# Patient Record
Sex: Male | Born: 1990 | Hispanic: Yes | Marital: Single | Smoking: Never smoker
Health system: Southern US, Community
[De-identification: ages and names within clinical notes are randomized; demographics above are authoritative.]

## PROBLEM LIST (undated history)

## (undated) DIAGNOSIS — E785 Hyperlipidemia, unspecified: Secondary | ICD-10-CM

## (undated) HISTORY — DX: Hyperlipidemia, unspecified: E78.5

---

## 2020-02-15 ENCOUNTER — Encounter (HOSPITAL_COMMUNITY): Payer: Self-pay | Admitting: Emergency Medicine

## 2020-02-15 ENCOUNTER — Emergency Department (HOSPITAL_COMMUNITY): Payer: Worker's Compensation

## 2020-02-15 ENCOUNTER — Other Ambulatory Visit: Payer: Self-pay

## 2020-02-15 ENCOUNTER — Emergency Department (HOSPITAL_COMMUNITY)
Admission: EM | Admit: 2020-02-15 | Discharge: 2020-02-15 | Disposition: A | Discharge status: Home / Self Care | Payer: Worker's Compensation | Attending: Emergency Medicine | Admitting: Emergency Medicine

## 2020-02-15 DIAGNOSIS — Y92410 Unspecified street and highway as the place of occurrence of the external cause: Secondary | ICD-10-CM | POA: Insufficient documentation

## 2020-02-15 DIAGNOSIS — Y9389 Activity, other specified: Secondary | ICD-10-CM | POA: Diagnosis not present

## 2020-02-15 DIAGNOSIS — S060X1A Concussion with loss of consciousness of 30 minutes or less, initial encounter: Secondary | ICD-10-CM | POA: Diagnosis not present

## 2020-02-15 DIAGNOSIS — S0990XA Unspecified injury of head, initial encounter: Secondary | ICD-10-CM | POA: Diagnosis present

## 2020-02-15 MED ORDER — IBUPROFEN 600 MG PO TABS: 600.0000 mg | ORAL_TABLET | Freq: Four times a day (QID) | ORAL | 0 refills | Status: DC | PRN

## 2020-02-15 MED ORDER — ACETAMINOPHEN 325 MG PO TABS: 650.0000 mg | ORAL_TABLET | Freq: Four times a day (QID) | ORAL | 0 refills | Status: AC | PRN

## 2020-02-15 MED ORDER — METHOCARBAMOL 500 MG PO TABS: 500.0000 mg | ORAL_TABLET | Freq: Two times a day (BID) | ORAL | 0 refills | Status: AC

## 2020-02-15 NOTE — ED Notes (Signed)
c-collar applied in triage
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
## 2022-05-04 DIAGNOSIS — R7989 Other specified abnormal findings of blood chemistry: Secondary | ICD-10-CM

## 2022-05-04 HISTORY — DX: Other specified abnormal findings of blood chemistry: R79.89

## 2022-07-03 HISTORY — PX: NO PAST SURGERIES: SHX2092

## 2022-07-20 ENCOUNTER — Ambulatory Visit (INDEPENDENT_AMBULATORY_CARE_PROVIDER_SITE_OTHER): Payer: BC Managed Care – PPO | Admitting: Medical

## 2022-07-20 ENCOUNTER — Encounter: Payer: Self-pay | Admitting: Medical

## 2022-07-20 VITALS — BP 120/70 | HR 58 | Ht 70.5 in | Wt 244.6 lb

## 2022-07-20 DIAGNOSIS — Z7185 Encounter for immunization safety counseling: Secondary | ICD-10-CM

## 2022-07-20 DIAGNOSIS — Z13220 Encounter for screening for lipoid disorders: Secondary | ICD-10-CM | POA: Diagnosis not present

## 2022-07-20 DIAGNOSIS — Z113 Encounter for screening for infections with a predominantly sexual mode of transmission: Secondary | ICD-10-CM | POA: Diagnosis not present

## 2022-07-20 DIAGNOSIS — Z87828 Personal history of other (healed) physical injury and trauma: Secondary | ICD-10-CM

## 2022-07-20 DIAGNOSIS — Z23 Encounter for immunization: Secondary | ICD-10-CM

## 2022-07-20 DIAGNOSIS — Z0000 Encounter for general adult medical examination without abnormal findings: Secondary | ICD-10-CM

## 2022-07-20 DIAGNOSIS — R5383 Other fatigue: Secondary | ICD-10-CM

## 2022-07-20 DIAGNOSIS — Z6834 Body mass index (BMI) 34.0-34.9, adult: Secondary | ICD-10-CM | POA: Insufficient documentation

## 2022-07-20 LAB — POCT URINALYSIS DIP (PROADVANTAGE DEVICE)
Bilirubin, UA: NEGATIVE
Blood, UA: NEGATIVE
Glucose, UA: NEGATIVE mg/dL
Ketones, POC UA: NEGATIVE mg/dL
Leukocytes, UA: NEGATIVE
Nitrite, UA: NEGATIVE
Protein Ur, POC: NEGATIVE mg/dL
Specific Gravity, Urine: 1.015
Urobilinogen, Ur: NEGATIVE
pH, UA: 6 (ref 5.0–8.0)

## 2022-07-20 NOTE — Progress Notes (Signed)
Subjective:   HPI  Russell Henry is a 32 y.o. male who presents for Chief Complaint  Patient presents with   new pt fasting cpe    New pt fasting cpe, would like STD and Testosterone labs done. Took Test booster form [facility]    Patient Care Team: Florean Hoobler, Leward Quan as PCP - General (Family Medicine)   Concerns: Lifts weights, concerned about testosterone levels.  Worried about dropping levels with age.  Has used prior OTC testosterone boosters from [facility], doing body building.    Starts work 1am, so at times feels fatigue but works atypical hours.   No prior testosterone injectables legal or illegal.    May snore lightly.    No issues with erections lately.  Reviewed their medical, surgical, family, social, medication, and allergy history and updated chart as appropriate.  No Known Allergies  History reviewed. No pertinent past medical history.  Current Outpatient Medications on File Prior to Visit  Medication Sig Dispense Refill   cyanocobalamin (VITAMIN B12) 1000 MCG tablet Take 1,000 mcg by mouth daily.     Multiple Vitamins-Minerals (VITAMINS TO GO MEN) MISC Take 1 tablet by mouth daily.     Omega-3 Fatty Acids (FISH OIL ADULT GUMMIES PO) Take by mouth.     VITAMIN D, ERGOCALCIFEROL, PO Take by mouth.     Zinc Sulfate (ZINC 15 PO) Take by mouth.     No current facility-administered medications on file prior to visit.      Current Outpatient Medications:    cyanocobalamin (VITAMIN B12) 1000 MCG tablet, Take 1,000 mcg by mouth daily., Disp: , Rfl:    Multiple Vitamins-Minerals (VITAMINS TO GO MEN) MISC, Take 1 tablet by mouth daily., Disp: , Rfl:    Omega-3 Fatty Acids (FISH OIL ADULT GUMMIES PO), Take by mouth., Disp: , Rfl:    VITAMIN D, ERGOCALCIFEROL, PO, Take by mouth., Disp: , Rfl:    Zinc Sulfate (ZINC 15 PO), Take by mouth., Disp: , Rfl:   Family History  Problem Relation Age of Onset   Diabetes Mother    Autism Brother    Cancer Maternal Grandmother     Heart disease Neg Hx    Stroke Neg Hx     Past Surgical History:  Procedure Laterality Date   NO PAST SURGERIES  07/2022     Review of Systems  Constitutional:  Positive for malaise/fatigue. Negative for chills, fever and weight loss.  HENT:  Negative for congestion, ear pain, hearing loss, sore throat and tinnitus.   Eyes:  Negative for blurred vision, pain and redness.  Respiratory:  Negative for cough, hemoptysis and shortness of breath.   Cardiovascular:  Negative for chest pain, palpitations, orthopnea, claudication and leg swelling.  Gastrointestinal:  Negative for abdominal pain, blood in stool, constipation, diarrhea, nausea and vomiting.  Genitourinary:  Negative for dysuria, flank pain, frequency, hematuria and urgency.  Musculoskeletal:  Negative for falls, joint pain and myalgias.  Skin:  Negative for itching and rash.  Neurological:  Negative for dizziness, tingling, speech change, weakness and headaches.  Endo/Heme/Allergies:  Negative for polydipsia. Does not bruise/bleed easily.  Psychiatric/Behavioral:  Negative for depression and memory loss. The patient is not nervous/anxious and does not have insomnia.      Objective:  BP 120/70   Pulse (!) 58   Ht 5' 10.5" (1.791 m)   Wt 244 lb 9.6 oz (110.9 kg)   BMI 34.60 kg/m   General appearance: alert, no distress, WD/WN, Caucasian male Skin:  Unremarkable, no worrisome lesions, tattoo right forearm and right forearm HEENT: normocephalic, conjunctiva/corneas normal, sclerae anicteric, PERRLA, EOMi, nares patent, no discharge or erythema, pharynx normal Oral cavity: MMM, tongue normal, teeth normal Neck: supple, no lymphadenopathy, no thyromegaly, no masses, normal ROM, no bruits Chest: Asymmetry of the right chest muscles compared to the left particularly flexed, strength overall seems to be pretty good bilaterally with chest muscles, otherwise non tender, normal shape and expansion Heart: RRR, normal S1, S2, no  murmurs Lungs: CTA bilaterally, no wheezes, rhonchi, or rales Abdomen: +bs, soft, non tender, non distended, no masses, no hepatomegaly, no splenomegaly, no bruits Back: non tender, normal ROM, no scoliosis Musculoskeletal: upper extremities non tender, no obvious deformity, normal ROM throughout, lower extremities non tender, no obvious deformity, normal ROM throughout Extremities: no edema, no cyanosis, no clubbing Pulses: 2+ symmetric, upper and lower extremities, normal cap refill Neurological: alert, oriented x 3, CN2-12 intact, strength normal upper extremities and lower extremities, sensation normal throughout, DTRs 2+ throughout, no cerebellar signs, gait normal Psychiatric: normal affect, behavior normal, pleasant  GU: normal male external genitalia,circumcised, nontender, no masses, no hernia, no lymphadenopathy Rectal: deferred    Assessment and Plan :   Encounter Diagnoses  Name Primary?   Encounter for health maintenance examination in adult Yes   Screening for lipid disorders    Screening for STD (sexually transmitted disease)    Fatigue, unspecified type    Vaccine counseling    BMI 34.0-34.9,adult    History of injury of muscle     This visit was a preventative care visit, also known as wellness visit or routine physical.   Topics typically include healthy lifestyle, diet, exercise, preventative care, vaccinations, sick and well care, proper use of emergency dept and after hours care, as well as other concerns.     Separate significant issues discussed: We discussed his concerns about checking testosterone and other hormones.  Baseline labs today.  No concern for sleep apnea.  History of pectoralis muscle injury on the right-referral to orthopedics for consult given his asymmetry.  Injury was 2 years ago.    General Recommendations: Continue to return yearly for your annual wellness and preventative care visits.  This gives [place] a chance to discuss healthy  lifestyle, exercise, vaccinations, review your chart record, and perform screenings where appropriate.  I recommend you see your eye doctor yearly for routine vision care.  I recommend you see your dentist yearly for routine dental care including hygiene visits twice yearly.   Vaccination  Vaccine recommendations: Tetanus booster every 10 years Flu shot yearly  Vaccines administered today: Counseled on the Tdap (tetanus, diptheria, and acellular pertussis) vaccine.  Vaccine information sheet given. Tdap vaccine given after consent obtained.   Screening for cancer: Colon cancer screening: Age 29   Testicular cancer screening You should do a monthly self testicular exam if you are between 49-64 years old, and we typically do a testicular exam on the yearly physical for this same age group.   Prostate Cancer screening: The recommended prostate cancer screening test is a blood test called the prostate-specific antigen (PSA) test. PSA is a protein that is made in the prostate. As you age, your prostate naturally produces more PSA. Abnormally high PSA levels may be caused by: Prostate cancer. An enlarged prostate that is not caused by cancer (benign prostatic hyperplasia, or BPH). This condition is very common in older men. A prostate gland infection (prostatitis) or urinary tract infection. Certain medicines such as male  hormones (like testosterone) or other medicines that raise testosterone levels. A rectal exam may be done as part of prostate cancer screening to help provide information about the size of your prostate gland. When a rectal exam is performed, it should be done after the PSA level is drawn to avoid any effect on the results.   Skin cancer screening: Check your skin regularly for new changes, growing lesions, or other lesions of concern Come in for evaluation if you have skin lesions of concern.   Lung cancer screening: If you have a greater than 20 pack year history  of tobacco use, then you may qualify for lung cancer screening with a chest CT scan.   Please call your insurance company to inquire about coverage for this test.   Pancreatic cancer:  no current screening test is available or routinely recommended. (risk factors: smoking, overweight or obese, diabetes, chronic pancreatitis, work exposure - dry cleaning, metal working, 31yo>, M>F, Sales promotion account executive, family hx/o, hereditary breast, ovarian, melanoma, lynch, peutz-jeghers).  Symptoms: jaundice, dark urine, light color or greasy stools, itchy skin, belly or back pain, weight loss, poor appetite, nausea, vomiting, liver enlargement, DVT/blood clots.   We currently don't have screenings for other cancers besides breast, cervical, colon, and lung cancers.  If you have a strong family history of cancer or have other cancer screening concerns, please let me know.  Genetic testing referral is an option for individuals with high cancer risk in the family.  There are some other cancer screenings in development currently.   Bone health: Get at least 150 minutes of aerobic exercise weekly Get weight bearing exercise at least once weekly Bone density test:  A bone density test is an imaging test that uses a type of X-ray to measure the amount of calcium and other minerals in your bones. The test may be used to diagnose or screen you for a condition that causes weak or thin bones (osteoporosis), predict your risk for a broken bone (fracture), or determine how well your osteoporosis treatment is working. The bone density test is recommended for females 67 and older, or females or males [id number] if certain risk factors such as thyroid disease, long term use of steroids such as for asthma or rheumatological issues, vitamin D deficiency, estrogen deficiency, family history of osteoporosis, self or family history of fragility fracture in first degree relative.    Heart health: Get at least 150 minutes of aerobic exercise  weekly Limit alcohol It is important to maintain a healthy blood pressure and healthy cholesterol numbers  Heart disease screening: Screening for heart disease includes screening for blood pressure, fasting lipids, glucose/diabetes screening, BMI height to weight ratio, reviewed of smoking status, physical activity, and diet.    Goals include blood pressure 120/80 or less, maintaining a healthy lipid/cholesterol profile, preventing diabetes or keeping diabetes numbers under good control, not smoking or using tobacco products, exercising most days per week or at least 150 minutes per week of exercise, and eating healthy variety of fruits and vegetables, healthy oils, and avoiding unhealthy food choices like fried food, fast food, high sugar and high cholesterol foods.     Vascular disease screening: For higher risk individuals including smokers, diabetics, patients with known heart disease or high blood pressure, kidney disease, and others, screening for vascular disease or atherosclerosis of the arteries is available.  Examples may include carotid ultrasound, abdominal aortic ultrasound, ABI blood flow screening in the legs, thoracic aorta screening.   Medical care options: I  recommend you continue to seek care here first for routine care.  We try really hard to have available appointments Monday through Friday daytime hours for sick visits, acute visits, and physicals.  Urgent care should be used for after hours and weekends for significant issues that cannot wait till the next day.  The emergency department should be used for significant potentially life-threatening emergencies.  The emergency department is expensive, can often have long wait times for less significant concerns, so try to utilize primary care, urgent care, or telemedicine when possible to avoid unnecessary trips to the emergency department.  Virtual visits and telemedicine have been introduced since the pandemic started in 2020, and  can be convenient ways to receive medical care.  We offer virtual appointments as well to assist you in a variety of options to seek medical care.   Legal  Take the time to do a last will and testament, Advanced Directives including [place] and Living Will documents.  Don't leave your family with burdens that can be handled ahead of time.   Advanced Directives: I recommend you consider completing a [place] and Living Will.   These documents respect your wishes and help alleviate burdens on your loved ones if you were to become terminally ill or be in a position to need those documents enforced.    You can complete Advanced Directives yourself, have them notarized, then have copies made for our office, for you and for anybody you feel should have them in safe keeping.  Or, you can have an attorney prepare these documents.   If you haven't updated your Last Will and Testament in a while, it may be worthwhile having an attorney prepare these documents together and save on some costs.       Spiritual and Emotional Health Keeping a healthy spiritual life can help you better manage your physical health. Your spiritual life can help you to cope with any issues that may arise with your physical health.  Balance can keep [place] healthy and help [place] to recover.  If you are struggling with your spiritual health there are questions that you may want to ask yourself:  What makes me feel most complete? When do I feel most connected to the rest of the world? Where do I find the most inner strength? What am I doing when I feel whole?  Helpful tips: Being in nature. Some people feel very connected and at peace when they are walking outdoors or are outside. Helping others. Some feel the largest sense of wellbeing when they are of service to others. Being of service can take on many forms. It can be doing volunteer work, being kind to strangers, or offering a hand to a  friend in need. Gratitude. Some people find they feel the most connected when they remain grateful. They may make lists of all the things they are grateful for or say a thank you out loud for all they have.    Emotional Health Are you in tune with your emotional health?  Check out this link: http://www.bray.com/    Financial Health Make sure you use a budget for your personal finances Make sure you are insured against risks (health insurance, life insurance, auto insurance, etc) Save more, spend less Set financial goals If you need help in this area, good resources include counseling through [employer] or other community resources, have a meeting with a Emergency planning/management officer, and a good resource is  the Luvenia Heller podcast    Jd was seen today for new pt fasting cpe.  Diagnoses and all orders for this visit:  Encounter for health maintenance examination in adult -     Comprehensive metabolic panel -     CBC -     Lipid panel -     Testosterone -     RPR+HIV+GC+CT Panel -     Hepatitis C antibody -     Hepatitis B surface antigen -     POCT Urinalysis DIP (Proadvantage Device) -     TSH  Screening for lipid disorders -     Lipid panel  Screening for STD (sexually transmitted disease) -     RPR+HIV+GC+CT Panel -     Hepatitis C antibody -     Hepatitis B surface antigen  Fatigue, unspecified type -     Testosterone -     TSH  Vaccine counseling -     Tdap vaccine greater than or equal to 7yo IM  BMI 34.0-34.9,adult  History of injury of muscle -     Ambulatory referral to Orthopedics     Follow-up pending labs, yearly for physical

## 2022-07-21 ENCOUNTER — Telehealth: Payer: Self-pay

## 2022-07-21 NOTE — Telephone Encounter (Signed)
Pt returned call. I advised him of his lab results and scheduled follow up appt.

## 2022-07-21 NOTE — Progress Notes (Signed)
There were several findings on your labs.  Calcium slightly elevated, 1 liver test slightly elevated, hemoglobin is higher than normal, cholesterol did not look good, testosterone low.  Thyroid normal  HIV and syphilis negative, hepatitis B and C negative.  Still pending gonorrhea and Chlamydia test.  I recommend a follow-up visit to discuss these labs further, to possibly further investigate the testosterone issue, to discuss ways to improve on some of these findings.  We may need to do other test related to the hemoglobin being high.  Please make a follow-up visit to discuss

## 2022-07-22 LAB — COMPREHENSIVE METABOLIC PANEL
ALT: 70 IU/L — ABNORMAL HIGH (ref 0–44)
AST: 34 IU/L (ref 0–40)
Albumin/Globulin Ratio: 1.9 (ref 1.2–2.2)
Albumin: 5.1 g/dL (ref 4.1–5.1)
Alkaline Phosphatase: 77 IU/L (ref 44–121)
BUN/Creatinine Ratio: 12 (ref 9–20)
BUN: 14 mg/dL (ref 6–20)
Bilirubin Total: 0.9 mg/dL (ref 0.0–1.2)
CO2: 21 mmol/L (ref 20–29)
Calcium: 10.4 mg/dL — ABNORMAL HIGH (ref 8.7–10.2)
Chloride: 99 mmol/L (ref 96–106)
Creatinine, Ser: 1.2 mg/dL (ref 0.76–1.27)
Globulin, Total: 2.7 g/dL (ref 1.5–4.5)
Glucose: 93 mg/dL (ref 70–99)
Potassium: 4.7 mmol/L (ref 3.5–5.2)
Sodium: 139 mmol/L (ref 134–144)
Total Protein: 7.8 g/dL (ref 6.0–8.5)
eGFR: 83 mL/min/{1.73_m2} (ref >59)

## 2022-07-22 LAB — RPR+HIV+GC+CT PANEL
Chlamydia trachomatis, NAA: NEGATIVE
HIV Screen 4th Generation wRfx: NONREACTIVE
Neisseria Gonorrhoeae by PCR: NEGATIVE
RPR Ser Ql: NONREACTIVE

## 2022-07-22 LAB — HEPATITIS B SURFACE ANTIGEN: Hepatitis B Surface Ag: NEGATIVE

## 2022-07-22 LAB — LIPID PANEL
Chol/HDL Ratio: 6.8 ratio — ABNORMAL HIGH (ref 0.0–5.0)
Cholesterol, Total: 246 mg/dL — ABNORMAL HIGH (ref 100–199)
HDL: 36 mg/dL — ABNORMAL LOW (ref >39)
LDL Chol Calc (NIH): 173 mg/dL — ABNORMAL HIGH (ref 0–99)
Triglycerides: 196 mg/dL — ABNORMAL HIGH (ref 0–149)
VLDL Cholesterol Cal: 37 mg/dL (ref 5–40)

## 2022-07-22 LAB — CBC
Hematocrit: 51.6 % — ABNORMAL HIGH (ref 37.5–51.0)
Hemoglobin: 18.3 g/dL — ABNORMAL HIGH (ref 13.0–17.7)
MCH: 30.7 pg (ref 26.6–33.0)
MCHC: 35.5 g/dL (ref 31.5–35.7)
MCV: 86 fL (ref 79–97)
Platelets: 259 10*3/uL (ref 150–450)
RBC: 5.97 x10E6/uL — ABNORMAL HIGH (ref 4.14–5.80)
RDW: 13.9 % (ref 11.6–15.4)
WBC: 7.5 10*3/uL (ref 3.4–10.8)

## 2022-07-22 LAB — HEPATITIS C ANTIBODY: Hep C Virus Ab: NONREACTIVE

## 2022-07-22 LAB — TSH: TSH: 0.98 u[IU]/mL (ref 0.450–4.500)

## 2022-07-22 LAB — TESTOSTERONE: Testosterone: 223 ng/dL — ABNORMAL LOW (ref 264–916)

## 2022-07-22 NOTE — Progress Notes (Signed)
Results sent through MyChart

## 2022-08-10 ENCOUNTER — Ambulatory Visit: Payer: BC Managed Care – PPO | Admitting: Medical

## 2022-08-10 ENCOUNTER — Ambulatory Visit: Payer: BC Managed Care – PPO | Admitting: Orthopedic Surgery

## 2022-08-10 ENCOUNTER — Encounter: Payer: Self-pay | Admitting: Orthopedic Surgery

## 2022-08-10 VITALS — BP 110/70 | HR 75 | Wt 242.0 lb

## 2022-08-10 DIAGNOSIS — D582 Other hemoglobinopathies: Secondary | ICD-10-CM

## 2022-08-10 DIAGNOSIS — E782 Mixed hyperlipidemia: Secondary | ICD-10-CM

## 2022-08-10 DIAGNOSIS — S29011A Strain of muscle and tendon of front wall of thorax, initial encounter: Secondary | ICD-10-CM

## 2022-08-10 DIAGNOSIS — R7989 Other specified abnormal findings of blood chemistry: Secondary | ICD-10-CM

## 2022-08-10 NOTE — Patient Instructions (Signed)
  Low testosterone Symptoms can be low sex drive, lack of energy, fatigue If you want to potentially pursue treatment or to confirm that your testosterone was low, we typically do 2 different readings on 2 different days.  There are other labs including prolactin, FSH/LH that we have to check if we are going to pursue treatment They are definitely risks and benefits to treatment of testosterone Limit or avoid soy-based foods Work on maintaining a healthy weight, less body fat which helps improve testosterone Consider over-the-counter zinc supplement twice a day which can help testosterone   Elevated hemoglobin The plan would be to recheck this with the lab If your hemoglobin continues to be elevated, we may need to do additional testing to try to narrow down why her hemoglobin might be elevated Some examples include blood disorders, smoking, living at higher elevations Consider donating blood every 1 to 2 months which could be good for your community but also helps thin the blood   Elevated calcium Likely benign, but the plan will be to recheck a calcium level in a few months   Mixed lipids including elevated triglycerides, elevated LDL cholesterol and low HDL cholesterol Try to get more fiber in the diet Eat fruits and vegetables every day Limit dairy particularly cheese and ice cream Consider less animal product in the intake in general

## 2022-08-10 NOTE — Progress Notes (Signed)
Subjective:  Russell Henry is a 32 y.o. male who presents for Chief Complaint  Patient presents with   discuss lab results    Discuss lab results     Here for follow-up well visit in March 2024, new patient visit  Here to discuss lab findings that were abnormal  Calcium was very slightly elevated.  He has noes concerns about this.  No prior elevated calcium  Recently his liver tests were slightly elevated.  He thinks he had some alcohol the day before the blood test.  He also notes regarding the liver and calcium he does take nutritional supplements and bodybuilding supplements  Hemoglobin was elevated recently.  He denies tobacco use or recent hiking or being at high elevation.  No history of elevated hemoglobin  He has a heavy meat intake in his diet, more of a carnivorous diet.  He also was reading about his abnormal labs and realizes he probably does not get enough fiber so he has been recently trying to get more fiber intake  In the past he has been into bodybuilding and exercise regularly.  But lately has not had as much motivation.  Regarding low testosterone he has used some testosterone supplements from nutritional supplements before.  He denies prior injectable testosterone  No other aggravating or relieving factors.    No other c/o.  The following portions of the patient's history were reviewed and updated as appropriate: allergies, current medications, past family history, past medical history, past social history, past surgical history and problem list.  ROS Otherwise as in subjective above  Objective: BP 110/70   Pulse 75   Wt 242 lb (109.8 kg)   BMI 34.23 kg/m   General appearance: alert, no distress, well developed, well nourished    Assessment: Encounter Diagnoses  Name Primary?   Low testosterone Yes   Elevated hemoglobin    Serum calcium elevated    Elevated LFTs    Mixed dyslipidemia      Plan: We discussed your recent lab findings  Low  testosterone Symptoms can be low sex drive, lack of energy, fatigue If you want to potentially pursue treatment or to confirm that your testosterone was low, we typically do 2 different readings on 2 different days.  There are other labs including prolactin, FSH/LH that we have to check if we are going to pursue treatment They are definitely risks and benefits to treatment of testosterone Limit or avoid soy-based foods Work on maintaining a healthy weight, less body fat which helps improve testosterone Consider over-the-counter zinc supplement twice a day which can help testosterone   Elevated hemoglobin The plan would be to recheck this with the lab If your hemoglobin continues to be elevated, we may need to do additional testing to try to narrow down why her hemoglobin might be elevated Some examples include blood disorders, smoking, living at higher elevations Consider donating blood every 1 to 2 months which could be good for your community but also helps thin the blood   Elevated calcium Likely benign, but the plan will be to recheck a calcium level in a few months   Mixed lipids including elevated triglycerides, elevated LDL cholesterol and low HDL cholesterol Try to get more fiber in the diet Eat fruits and vegetables every day Limit dairy particularly cheese and ice cream Consider less animal product in the intake in general Recheck labs on next physical   Russell Henry was seen today for discuss lab results.  Diagnoses and all orders for  this visit:  Low testosterone -     Testosterone; Future -     Prolactin; Future -     FSH/LH; Future  Elevated hemoglobin -     CBC with Differential/Platelet; Future -     Iron; Future  Serum calcium elevated  Elevated LFTs -     Iron; Future  Mixed dyslipidemia    Follow up: at his convenience for labs

## 2022-08-10 NOTE — Progress Notes (Signed)
Office Visit Note   Patient: Russell Henry           Date of Birth: Sep 29, 1990           MRN: [id number] Visit Date: 08/10/2022 Requested by: Jac Canavan, PA-C [address] [place],  [place] [address] PCP: Jac Canavan, PA-C  Subjective: Chief Complaint  Patient presents with   Other     Pec injury 2 years ago with continued occasional discomfort.     HPI: Russell Henry is a 32 y.o. male who presents to the office reporting right pectoralis muscle deformity.  He states he injured his right pectoralis tendon about 2 years ago.  Currently drives a truck.  Not having too much difficulty with the arm but just wants to get the final word on operative treatment versus nonoperative treatment.  Otherwise his shoulder functions well..                ROS: All systems reviewed are negative as they relate to the chief complaint within the history of present illness.  Patient denies fevers or chills.  Assessment & Plan: Visit Diagnoses:  1. Traumatic rupture of right pectoralis major tendon     Plan: Impression is chronic pec tendon rupture.  This is retracted least halfway across the chest wall.  Unlikely to be fixable at this time without major intervention and grafting.  I think he is very functional with the way it is right now.  Not having too much deficit.  He does not plan on doing any type of weightlifting shows her anything of that nature so I think he is going to live with this for now.  He will follow-up as needed.  Follow-Up Instructions: No follow-ups on file.   Orders:  No orders of the defined types were placed in this encounter.  No orders of the defined types were placed in this encounter.     Procedures: No procedures performed   Clinical Data: No additional findings.  Objective: Vital Signs: There were no vitals taken for this visit.  Physical Exam:  Constitutional: Patient appears well-developed HEENT:  Head: Normocephalic Eyes:EOM are  normal Neck: Normal range of motion Cardiovascular: Normal rate Pulmonary/chest: Effort normal Neurologic: Patient is alert Skin: Skin is warm Psychiatric: Patient has normal mood and affect  Ortho Exam: Ortho exam demonstrates full active and passive range of motion of both shoulders.  Does have pectoralis major muscle deformity on the right compared to the left.  Internal rotation strength is 5+ out of 5 bilaterally.  Excellent grip Tatar cuff strength infraspinatus supraspinatus and subscap muscle testing with no biceps muscle deformity right versus left.  He does have pec muscle retraction to the middle of the right chest wall region.  The deformity is not really visible and with the attempts to flex his pec muscles.  Specialty Comments:  No specialty comments available.  Imaging: No results found.   PMFS History: Patient Active Problem List   Diagnosis Date Noted   Encounter for health maintenance examination in adult 07/20/2022   Screening for lipid disorders 07/20/2022   Screening for STD (sexually transmitted disease) 07/20/2022   Fatigue 07/20/2022   BMI 34.0-34.9,adult 07/20/2022   Vaccine counseling 07/20/2022   History of injury of muscle 07/20/2022   History reviewed. No pertinent past medical history.  Family History  Problem Relation Age of Onset   Diabetes Mother    Autism Brother    Cancer Maternal Grandmother    Heart disease  Neg Hx    Stroke Neg Hx     Past Surgical History:  Procedure Laterality Date   NO PAST SURGERIES  07/2022   Social History   Occupational History   Not on file  Tobacco Use   Smoking status: Never   Smokeless tobacco: Never  Substance and Sexual Activity   Alcohol use: Yes    Alcohol/week: 3.0 standard drinks of alcohol    Types: 3 Shots of liquor per week   Drug use: Never   Sexual activity: Not on file

## 2022-09-14 ENCOUNTER — Other Ambulatory Visit: Payer: BC Managed Care – PPO

## 2023-02-08 ENCOUNTER — Ambulatory Visit: Payer: BC Managed Care – PPO | Admitting: Medical

## 2023-08-02 ENCOUNTER — Telehealth: Payer: Self-pay | Admitting: Internal Medicine

## 2023-08-02 NOTE — Telephone Encounter (Signed)
 Left message for pt that we were returning his call

## 2023-08-02 NOTE — Telephone Encounter (Signed)
 We are not seeing anything attached so our front desk does not know what kind of appointment he needs.

## 2023-08-02 NOTE — Telephone Encounter (Signed)
 I have sent a message back to the E2C2 person to see if they can find out what time of appointment patient needs, not sure if they will reply, but might just need to call the patient to ask

## 2023-08-02 NOTE — Telephone Encounter (Signed)
 Please call patient to schedule an appointment. I do not see anything attached and nothing currently in media.   Copied from CRM [id number]. Topic: Appointments - Appointment Scheduling >> Aug 02, 2023 12:05 PM Russell Henry wrote: Patient/patient representative is calling to schedule an appointment. Refer to attachments for appointment information. Pt needs lab order

## 2023-11-02 HISTORY — PX: NO PAST SURGERIES: SHX2092

## 2023-11-08 ENCOUNTER — Ambulatory Visit: Admitting: Medical

## 2023-11-08 ENCOUNTER — Encounter: Payer: Self-pay | Admitting: Medical

## 2023-11-08 VITALS — BP 106/66 | HR 74 | Ht 70.0 in | Wt 241.0 lb

## 2023-11-08 DIAGNOSIS — Z13220 Encounter for screening for lipoid disorders: Secondary | ICD-10-CM

## 2023-11-08 DIAGNOSIS — Z0000 Encounter for general adult medical examination without abnormal findings: Secondary | ICD-10-CM

## 2023-11-08 DIAGNOSIS — Z833 Family history of diabetes mellitus: Secondary | ICD-10-CM

## 2023-11-08 DIAGNOSIS — Z113 Encounter for screening for infections with a predominantly sexual mode of transmission: Secondary | ICD-10-CM | POA: Diagnosis not present

## 2023-11-08 DIAGNOSIS — R7989 Other specified abnormal findings of blood chemistry: Secondary | ICD-10-CM | POA: Diagnosis not present

## 2023-11-08 DIAGNOSIS — Z131 Encounter for screening for diabetes mellitus: Secondary | ICD-10-CM

## 2023-11-08 DIAGNOSIS — Z136 Encounter for screening for cardiovascular disorders: Secondary | ICD-10-CM

## 2023-11-08 NOTE — Progress Notes (Signed)
 Subjective:   HPI  Russell Henry is a 33 y.o. male who presents for Chief Complaint  Patient presents with   Annual Exam    Fasting cpe, last ate at 10am, no concerns, declines hep b and HPV shots    Patient Care Team: Cristianna Cyr, Alm GORMAN RIGGERS as PCP - General (Family Medicine) Dr. Glendia Hutchinson, orthopedics   Concerns: No particular current issues. Wants to recheck labs that were abnormal last year.  Non fasting today   Reviewed their medical, surgical, family, social, medication, and allergy history and updated chart as appropriate.  No Known Allergies  Past Medical History:  Diagnosis Date   Low testosterone  2024    Current Outpatient Medications on File Prior to Visit  Medication Sig Dispense Refill   cyanocobalamin (VITAMIN B12) 1000 MCG tablet Take 1,000 mcg by mouth daily.     Multiple Vitamins-Minerals (VITAMINS TO GO MEN) MISC Take 1 tablet by mouth daily.     Omega-3 Fatty Acids (FISH OIL ADULT GUMMIES PO) Take by mouth.     VITAMIN D, ERGOCALCIFEROL, PO Take by mouth.     Zinc Sulfate (ZINC 15 PO) Take by mouth.     No current facility-administered medications on file prior to visit.      Current Outpatient Medications:    cyanocobalamin (VITAMIN B12) 1000 MCG tablet, Take 1,000 mcg by mouth daily., Disp: , Rfl:    Multiple Vitamins-Minerals (VITAMINS TO GO MEN) MISC, Take 1 tablet by mouth daily., Disp: , Rfl:    Omega-3 Fatty Acids (FISH OIL ADULT GUMMIES PO), Take by mouth., Disp: , Rfl:    VITAMIN D, ERGOCALCIFEROL, PO, Take by mouth., Disp: , Rfl:    Zinc Sulfate (ZINC 15 PO), Take by mouth., Disp: , Rfl:   Family History  Problem Relation Age of Onset   Diabetes Mother    Diabetes Father    Autism Brother    Cancer Maternal Grandmother    Heart disease Neg Hx    Stroke Neg Hx     Past Surgical History:  Procedure Laterality Date   NO PAST SURGERIES  11/2023    Review of Systems  Constitutional:  Negative for chills, fever, malaise/fatigue  and weight loss.  HENT:  Negative for congestion, ear pain, hearing loss, sore throat and tinnitus.   Eyes:  Negative for blurred vision, pain and redness.  Respiratory:  Negative for cough, hemoptysis and shortness of breath.   Cardiovascular:  Negative for chest pain, palpitations, orthopnea, claudication and leg swelling.  Gastrointestinal:  Negative for abdominal pain, blood in stool, constipation, diarrhea, nausea and vomiting.  Genitourinary:  Negative for dysuria, flank pain, frequency, hematuria and urgency.  Musculoskeletal:  Negative for falls, joint pain and myalgias.  Skin:  Negative for itching and rash.  Neurological:  Negative for dizziness, tingling, speech change, weakness and headaches.  Endo/Heme/Allergies:  Negative for polydipsia. Does not bruise/bleed easily.  Psychiatric/Behavioral:  Negative for depression and memory loss. The patient is not nervous/anxious and does not have insomnia.      Objective:  BP 106/66   Pulse 74   Ht 5' 10 (1.778 m)   Wt 241 lb (109.3 kg)   SpO2 97%   BMI 34.58 kg/m   Wt Readings from Last 3 Encounters:  11/08/23 241 lb (109.3 kg)  08/10/22 242 lb (109.8 kg)  07/20/22 244 lb 9.6 oz (110.9 kg)    General appearance: alert, no distress, WD/WN, Caucasian male Skin: Unremarkable, no worrisome lesions, tattoo right forearm  and right forearm HEENT: normocephalic, conjunctiva/corneas normal, sclerae anicteric, PERRLA, EOMi, nares patent, no discharge or erythema, pharynx normal Oral cavity: MMM, tongue normal, teeth normal Neck: supple, no lymphadenopathy, no thyromegaly, no masses, normal ROM, no bruits Heart: RRR, normal S1, S2, no murmurs Lungs: CTA bilaterally, no wheezes, rhonchi, or rales Abdomen: +bs, soft, non tender, non distended, no masses, no hepatomegaly, no splenomegaly, no bruits Back: non tender, normal ROM, no scoliosis Musculoskeletal: upper extremities non tender, no obvious deformity, normal ROM throughout, lower  extremities non tender, no obvious deformity, normal ROM throughout Extremities: no edema, no cyanosis, no clubbing Pulses: 2+ symmetric, upper and lower extremities, normal cap refill Neurological: alert, oriented x 3, CN2-12 intact, strength normal upper extremities and lower extremities, sensation normal throughout, DTRs 2+ throughout, no cerebellar signs, gait normal Psychiatric: normal affect, behavior normal, pleasant  GU: normal male external genitalia,circumcised, nontender, no masses, no hernia, no lymphadenopathy Rectal: deferred    Assessment and Plan :   Encounter Diagnoses  Name Primary?   Encounter for health maintenance examination in adult Yes   Encounter for lipid screening for cardiovascular disease    Low testosterone     Screen for STD (sexually transmitted disease)    Screening for diabetes mellitus    Family history of diabetes mellitus      This visit was a preventative care visit, also known as wellness visit or routine physical.   Topics typically include healthy lifestyle, diet, exercise, preventative care, vaccinations, sick and well care, proper use of emergency dept and after hours care, as well as other concerns.     Separate significant issues discussed: Return in 1 week for fasting labs.  Last year lipids were elevated.  Updated labs today.  Elevated LFTs  last year, repeat labs in 1 week fasting when he returns.   General Recommendations: Continue to return yearly for your annual wellness and preventative care visits.  This gives us  a chance to discuss healthy lifestyle, exercise, vaccinations, review your chart record, and perform screenings where appropriate.  I recommend you see your eye doctor yearly for routine vision care.  I recommend you see your dentist yearly for routine dental care including hygiene visits twice yearly.   Vaccination  Immunization History  Administered Date(s) Administered   Tdap 07/20/2022   Vaccine  recommendations: Tetanus booster every 10 years Flu shot yearly    Screening for cancer: Colon cancer screening: Age 15   Testicular cancer screening You should do a monthly self testicular exam if you are between 71-69 years old, and we typically do a testicular exam on the yearly physical for this same age group.   Prostate Cancer screening: The recommended prostate cancer screening test is a blood test called the prostate-specific antigen (PSA) test. PSA is a protein that is made in the prostate. As you age, your prostate naturally produces more PSA. Abnormally high PSA levels may be caused by: Prostate cancer. An enlarged prostate that is not caused by cancer (benign prostatic hyperplasia, or BPH). This condition is very common in older men. A prostate gland infection (prostatitis) or urinary tract infection. Certain medicines such as male hormones (like testosterone ) or other medicines that raise testosterone  levels. A rectal exam may be done as part of prostate cancer screening to help provide information about the size of your prostate gland. When a rectal exam is performed, it should be done after the PSA level is drawn to avoid any effect on the results.   Skin cancer screening:  Check your skin regularly for new changes, growing lesions, or other lesions of concern Come in for evaluation if you have skin lesions of concern.   Lung cancer screening: If you have a greater than 20 pack year history of tobacco use, then you may qualify for lung cancer screening with a chest CT scan.   Please call your insurance company to inquire about coverage for this test.   Pancreatic cancer:  no current screening test is available or routinely recommended. (risk factors: smoking, overweight or obese, diabetes, chronic pancreatitis, work exposure - dry cleaning, metal working, 32yo>, M>F, Tree surgeon, family hx/o, hereditary breast, ovarian, melanoma, lynch, peutz-jeghers).  Symptoms:  jaundice, dark urine, light color or greasy stools, itchy skin, belly or back pain, weight loss, poor appetite, nausea, vomiting, liver enlargement, DVT/blood clots.   We currently don't have screenings for other cancers besides breast, cervical, colon, and lung cancers.  If you have a strong family history of cancer or have other cancer screening concerns, please let me know.  Genetic testing referral is an option for individuals with high cancer risk in the family.  There are some other cancer screenings in development currently.   Bone health: Get at least 150 minutes of aerobic exercise weekly Get weight bearing exercise at least once weekly Bone density test:  A bone density test is an imaging test that uses a type of X-ray to measure the amount of calcium and other minerals in your bones. The test may be used to diagnose or screen you for a condition that causes weak or thin bones (osteoporosis), predict your risk for a broken bone (fracture), or determine how well your osteoporosis treatment is working. The bone density test is recommended for females 65 and older, or females or males <65 if certain risk factors such as thyroid disease, long term use of steroids such as for asthma or rheumatological issues, vitamin D deficiency, estrogen deficiency, family history of osteoporosis, self or family history of fragility fracture in first degree relative.    Heart health: Get at least 150 minutes of aerobic exercise weekly Limit alcohol It is important to maintain a healthy blood pressure and healthy cholesterol numbers  Heart disease screening: Screening for heart disease includes screening for blood pressure, fasting lipids, glucose/diabetes screening, BMI height to weight ratio, reviewed of smoking status, physical activity, and diet.    Goals include blood pressure 120/80 or less, maintaining a healthy lipid/cholesterol profile, preventing diabetes or keeping diabetes numbers under good  control, not smoking or using tobacco products, exercising most days per week or at least 150 minutes per week of exercise, and eating healthy variety of fruits and vegetables, healthy oils, and avoiding unhealthy food choices like fried food, fast food, high sugar and high cholesterol foods.     Vascular disease screening: For higher risk individuals including smokers, diabetics, patients with known heart disease or high blood pressure, kidney disease, and others, screening for vascular disease or atherosclerosis of the arteries is available.  Examples may include carotid ultrasound, abdominal aortic ultrasound, ABI blood flow screening in the legs, thoracic aorta screening.   Medical care options: I recommend you continue to seek care here first for routine care.  We try really hard to have available appointments Monday through Friday daytime hours for sick visits, acute visits, and physicals.  Urgent care should be used for after hours and weekends for significant issues that cannot wait till the next day.  The emergency department should be used for  significant potentially life-threatening emergencies.  The emergency department is expensive, can often have long wait times for less significant concerns, so try to utilize primary care, urgent care, or telemedicine when possible to avoid unnecessary trips to the emergency department.  Virtual visits and telemedicine have been introduced since the pandemic started in 2020, and can be convenient ways to receive medical care.  We offer virtual appointments as well to assist you in a variety of options to seek medical care.    Creighton Longley was seen today for annual exam.  Diagnoses and all orders for this visit:  Encounter for health maintenance examination in adult -     Comprehensive metabolic panel with GFR; Future -     CBC with Differential/Platelet; Future -     Lipid panel; Future -     Testosterone ; Future -     VITAMIN D 25 Hydroxy  (Vit-D Deficiency, Fractures); Future -     HIV Antibody (routine testing w rflx); Future -     RPR; Future -     GC/Chlamydia Probe Amp; Future -     Hemoglobin A1c; Future  Encounter for lipid screening for cardiovascular disease -     Lipid panel; Future  Low testosterone  -     Testosterone ; Future -     VITAMIN D 25 Hydroxy (Vit-D Deficiency, Fractures); Future  Screen for STD (sexually transmitted disease) -     HIV Antibody (routine testing w rflx); Future -     RPR; Future -     GC/Chlamydia Probe Amp; Future  Screening for diabetes mellitus -     Hemoglobin A1c; Future  Family history of diabetes mellitus -     Hemoglobin A1c; Future    Follow-up pending labs, yearly for physical

## 2023-11-15 ENCOUNTER — Other Ambulatory Visit

## 2023-11-15 DIAGNOSIS — Z0000 Encounter for general adult medical examination without abnormal findings: Secondary | ICD-10-CM

## 2023-11-15 DIAGNOSIS — Z833 Family history of diabetes mellitus: Secondary | ICD-10-CM

## 2023-11-15 DIAGNOSIS — Z13220 Encounter for screening for lipoid disorders: Secondary | ICD-10-CM

## 2023-11-15 DIAGNOSIS — R7989 Other specified abnormal findings of blood chemistry: Secondary | ICD-10-CM

## 2023-11-15 DIAGNOSIS — Z131 Encounter for screening for diabetes mellitus: Secondary | ICD-10-CM

## 2023-11-15 DIAGNOSIS — Z113 Encounter for screening for infections with a predominantly sexual mode of transmission: Secondary | ICD-10-CM

## 2023-11-15 LAB — LIPID PANEL

## 2023-11-16 ENCOUNTER — Ambulatory Visit: Payer: Self-pay | Admitting: Medical

## 2023-11-16 ENCOUNTER — Other Ambulatory Visit: Payer: Self-pay | Admitting: Medical

## 2023-11-16 LAB — COMPREHENSIVE METABOLIC PANEL WITH GFR
ALT: 29 IU/L (ref 0–44)
AST: 26 IU/L (ref 0–40)
Albumin: 4.2 g/dL (ref 4.1–5.1)
Alkaline Phosphatase: 78 IU/L (ref 44–121)
BUN/Creatinine Ratio: 12 (ref 9–20)
BUN: 13 mg/dL (ref 6–20)
Bilirubin Total: 0.4 mg/dL (ref 0.0–1.2)
CO2: 21 mmol/L (ref 20–29)
Calcium: 9.2 mg/dL (ref 8.7–10.2)
Chloride: 104 mmol/L (ref 96–106)
Creatinine, Ser: 1.1 mg/dL (ref 0.76–1.27)
Globulin, Total: 2.6 g/dL (ref 1.5–4.5)
Glucose: 99 mg/dL (ref 70–99)
Potassium: 4.6 mmol/L (ref 3.5–5.2)
Sodium: 140 mmol/L (ref 134–144)
Total Protein: 6.8 g/dL (ref 6.0–8.5)
eGFR: 91 mL/min/1.73 (ref >59)

## 2023-11-16 LAB — LIPID PANEL
Cholesterol, Total: 202 mg/dL — AB (ref 100–199)
HDL: 29 mg/dL — AB (ref >39)
LDL CALC COMMENT:: 7 ratio — AB (ref 0.0–5.0)
LDL Chol Calc (NIH): 150 mg/dL — AB (ref 0–99)
Triglycerides: 124 mg/dL (ref 0–149)
VLDL Cholesterol Cal: 23 mg/dL (ref 5–40)

## 2023-11-16 LAB — CBC WITH DIFFERENTIAL/PLATELET
Basophils Absolute: 0.1 x10E3/uL (ref 0.0–0.2)
Basos: 1 %
EOS (ABSOLUTE): 0.2 x10E3/uL (ref 0.0–0.4)
Eos: 3 %
Hematocrit: 53.6 % — ABNORMAL HIGH (ref 37.5–51.0)
Hemoglobin: 17.4 g/dL (ref 13.0–17.7)
Immature Grans (Abs): 0 x10E3/uL (ref 0.0–0.1)
Immature Granulocytes: 0 %
Lymphocytes Absolute: 2.6 x10E3/uL (ref 0.7–3.1)
Lymphs: 45 %
MCH: 30.2 pg (ref 26.6–33.0)
MCHC: 32.5 g/dL (ref 31.5–35.7)
MCV: 93 fL (ref 79–97)
Monocytes Absolute: 0.4 x10E3/uL (ref 0.1–0.9)
Monocytes: 7 %
Neutrophils Absolute: 2.5 x10E3/uL (ref 1.4–7.0)
Neutrophils: 44 %
Platelets: 270 x10E3/uL (ref 150–450)
RBC: 5.77 x10E6/uL (ref 4.14–5.80)
RDW: 13.1 % (ref 11.6–15.4)
WBC: 5.7 x10E3/uL (ref 3.4–10.8)

## 2023-11-16 LAB — TESTOSTERONE: Testosterone: 294 ng/dL (ref 264–916)

## 2023-11-16 LAB — RPR: RPR Ser Ql: NONREACTIVE

## 2023-11-16 LAB — HIV ANTIBODY (ROUTINE TESTING W REFLEX): HIV Screen 4th Generation wRfx: NONREACTIVE

## 2023-11-16 LAB — HEMOGLOBIN A1C
Est. average glucose Bld gHb Est-mCnc: 105 mg/dL
Hgb A1c MFr Bld: 5.3 % (ref 4.8–5.6)

## 2023-11-16 LAB — VITAMIN D 25 HYDROXY (VIT D DEFICIENCY, FRACTURES): Vit D, 25-Hydroxy: 35.3 ng/mL (ref 30.0–100.0)

## 2023-11-16 MED ORDER — ROSUVASTATIN CALCIUM 5 MG PO TABS: 5.0000 mg | ORAL_TABLET | ORAL | 1 refills | Status: AC

## 2023-11-16 MED ORDER — VITAMIN D (ERGOCALCIFEROL) 1.25 MG (50000 UNIT) PO CAPS: 50000.0000 [IU] | ORAL_CAPSULE | ORAL | 1 refills | Status: DC

## 2023-11-16 NOTE — Telephone Encounter (Signed)
 Waiting on labs as they have not come back yet  Copied from CRM [id number]. Topic: Clinical - Lab/Test Results >> Nov 16, 2023  3:53 PM Fonda T wrote: Reason for CRM: Patient inquiring of test results of  gonorrhea and Chlamydia test.  Results still pending at time of call. Patient requesting a call with final results. Patient can be reached to [phone].  Thank you

## 2023-11-16 NOTE — Progress Notes (Signed)
Results sent to MyChart

## 2023-11-17 LAB — GC/CHLAMYDIA PROBE AMP
Chlamydia trachomatis, NAA: NEGATIVE
Neisseria Gonorrhoeae by PCR: NEGATIVE

## 2023-11-17 NOTE — Progress Notes (Signed)
 Results sent through MyChart

## 2024-02-14 ENCOUNTER — Ambulatory Visit: Admitting: Medical

## 2024-02-14 ENCOUNTER — Encounter: Payer: Self-pay | Admitting: Medical

## 2024-02-14 VITALS — BP 110/70 | HR 66 | Wt 237.0 lb

## 2024-02-14 DIAGNOSIS — S29011A Strain of muscle and tendon of front wall of thorax, initial encounter: Secondary | ICD-10-CM

## 2024-02-14 DIAGNOSIS — E559 Vitamin D deficiency, unspecified: Secondary | ICD-10-CM

## 2024-02-14 DIAGNOSIS — E785 Hyperlipidemia, unspecified: Secondary | ICD-10-CM

## 2024-02-14 LAB — LIPID PANEL

## 2024-02-14 NOTE — Progress Notes (Signed)
 Name: Rexton Greulich   Date of Visit: 02/14/24   Date of last visit with me: 11/08/2023   CHIEF COMPLAINT:  Chief Complaint  Patient presents with   Follow-up    Fasting follow-up       HPI:  Discussed the use of AI scribe software for clinical note transcription with the patient, who gave verbal consent to proceed.  History of Present Illness  Venkat Ankney is a 33 year old male who presents for a follow-up visit regarding hyperlipidemia.  He has been on Crestor  5 mg every other day since July, along with over-the-counter fish oil and a multivitamin. He also takes an over-the-counter vitamin D  supplement, likely 5000 units, which includes vitamin D  and K2. No side effects from these medications. His cholesterol levels were high in July, prompting the initiation of Crestor . Since then, he has made dietary changes, switching from ground beef to ground [place] and brown rice for lunch and dinner, and reducing his intake of high cholesterol foods such as cheese and egg yolks. In the remote past he used to consume 10-12 eggs a week but would limit egg yolks to 2-3.   He sustained a minor tear in his pectoral muscle or bicep while lifting a refrigerator in late August, resulting in bruising from his neck to his elbow. He describes it as less severe than a previous tear on the other side. He experiences tightness and weakness when performing certain movements but overall feels better. He has resumed weightlifting with lighter weights and higher repetitions to aid recovery.  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 He does not smoke and is working on improving his eating habits and exercise routine to manage his cholesterol levels.  Past Medical History:  Diagnosis Date   Low testosterone  2024   Current Outpatient Medications on File Prior to Visit  Medication Sig Dispense Refill   cyanocobalamin (VITAMIN B12) 1000 MCG  tablet Take 1,000 mcg by mouth daily.     Multiple Vitamins-Minerals (VITAMINS TO GO MEN) MISC Take 1 tablet by mouth daily.     Omega-3 Fatty Acids (FISH OIL ADULT GUMMIES PO) Take by mouth.     rosuvastatin  (CRESTOR ) 5 MG tablet Take 1 tablet (5 mg total) by mouth every other day. 90 tablet 1   VITAMIN D , ERGOCALCIFEROL , PO Take by mouth.     Zinc Sulfate (ZINC 15 PO) Take by mouth.     No current facility-administered medications on file prior to visit.    ROS as in subjective   Objective: BP 110/70   Pulse 66   Wt 237 lb (107.5 kg)   SpO2 99%   BMI 34.01 kg/m   General: Well-developed and is no distress Left bicep and left arm nontender with no obvious deformity or swelling or bruising There seems to be a slight unusual rumination at the superior left pectoralis major with flexion, there is a flattened deformity of the right superior pectoralis major from prior injury, otherwise nontender, no swelling or bruising, normal range of motion, chest muscles with normal strength Bilateral upper extremity normal strength and sensation and normal pulses of the arms    Assessment: Encounter Diagnoses  Name Primary?   Dyslipidemia Yes   Vitamin D  deficiency    Chest wall muscle strain, initial encounter      Plan: Hyperlipidemia Hyperlipidemia with elevated LDL despite current treatment with Crestor  and lifestyle modifications. Triglycerides improved with fish oil. -  Recheck cholesterol levels with a lipid panel. - Continue Crestor  5 mg every other day. - Continue over-the-counter fish oil. - Encourage dietary modifications to reduce high cholesterol foods. - Encourage regular exercise.   Vitamin D  deficiency Vitamin D  deficiency managed with supplementation and adequate sun exposure. - Recheck calcium  levels. - Continue over-the-counter vitamin D  5000 IU. - Ensure adequate sun exposure.   Chest muscle strain, possible mild left pectoralis major minor tear about a month  ago - Advise light weight, high repetition exercises for rehabilitation. - Avoid heavy lifting and activities that may strain the muscle, use good lifting technique with help for large or heavy items - Consider referral to sports medicine specialist Dr. Vita if pain, bruising, or swelling occurs.   Esaw Knippel was seen today for follow-up.  Diagnoses and all orders for this visit:  Dyslipidemia -     Lipid panel -     Comprehensive metabolic panel with GFR  Vitamin D  deficiency -     VITAMIN D  25 Hydroxy (Vit-D Deficiency, Fractures)  Chest wall muscle strain, initial encounter    F/u pending labs

## 2024-02-15 ENCOUNTER — Ambulatory Visit: Payer: Self-pay | Admitting: Medical

## 2024-02-15 LAB — COMPREHENSIVE METABOLIC PANEL WITH GFR
ALT: 48 IU/L — ABNORMAL HIGH (ref 0–44)
AST: 35 IU/L (ref 0–40)
Albumin: 4.5 g/dL (ref 4.1–5.1)
Alkaline Phosphatase: 85 IU/L (ref 47–123)
BUN/Creatinine Ratio: 9 (ref 9–20)
BUN: 10 mg/dL (ref 6–20)
Bilirubin Total: 0.5 mg/dL (ref 0.0–1.2)
CO2: 21 mmol/L (ref 20–29)
Calcium: 9.6 mg/dL (ref 8.7–10.2)
Chloride: 99 mmol/L (ref 96–106)
Creatinine, Ser: 1.07 mg/dL (ref 0.76–1.27)
Globulin, Total: 2.5 g/dL (ref 1.5–4.5)
Glucose: 99 mg/dL (ref 70–99)
Potassium: 4.3 mmol/L (ref 3.5–5.2)
Sodium: 135 mmol/L (ref 134–144)
Total Protein: 7 g/dL (ref 6.0–8.5)
eGFR: 95 mL/min/1.73 (ref >59)

## 2024-02-15 LAB — LIPID PANEL
Cholesterol, Total: 146 mg/dL (ref 100–199)
HDL: 32 mg/dL — AB (ref >39)
LDL CALC COMMENT:: 4.6 ratio (ref 0.0–5.0)
LDL Chol Calc (NIH): 93 mg/dL (ref 0–99)
Triglycerides: 112 mg/dL (ref 0–149)
VLDL Cholesterol Cal: 21 mg/dL (ref 5–40)

## 2024-02-15 LAB — VITAMIN D 25 HYDROXY (VIT D DEFICIENCY, FRACTURES): Vit D, 25-Hydroxy: 118 ng/mL — AB (ref 30.0–100.0)

## 2024-02-15 NOTE — Progress Notes (Signed)
 Have lab add iron level given the elevated liver test

## 2024-02-16 NOTE — Progress Notes (Signed)
 Results to MyChart

## 2024-02-17 LAB — IRON: Iron: 69 ug/dL (ref 38–169)

## 2024-02-17 LAB — SPECIMEN STATUS REPORT

## 2024-02-18 NOTE — Progress Notes (Signed)
 Iron level normal.  Do you want to move forward with ultrasound of the liver?

## 2024-11-20 ENCOUNTER — Encounter: Payer: Self-pay | Admitting: Medical
